# Patient Record
Sex: Female | Born: 1980 | Race: Black or African American | Hispanic: No | Marital: Single | Smoking: Never smoker
Health system: Southern US, Community
[De-identification: ages and names within clinical notes are randomized; demographics above are authoritative.]

## PROBLEM LIST (undated history)

## (undated) DIAGNOSIS — F419 Anxiety disorder, unspecified: Secondary | ICD-10-CM

## (undated) DIAGNOSIS — E669 Obesity, unspecified: Secondary | ICD-10-CM

## (undated) DIAGNOSIS — R7303 Prediabetes: Secondary | ICD-10-CM

## (undated) DIAGNOSIS — E66811 Obesity, class 1: Secondary | ICD-10-CM

## (undated) HISTORY — DX: Obesity, unspecified: E66.9

## (undated) HISTORY — DX: Obesity, class 1: E66.811

## (undated) HISTORY — DX: Prediabetes: R73.03

## (undated) HISTORY — DX: Anxiety disorder, unspecified: F41.9

## (undated) HISTORY — PX: OTHER SURGICAL HISTORY: SHX169

---

## 2009-09-11 ENCOUNTER — Inpatient Hospital Stay (HOSPITAL_COMMUNITY): Admission: AD | Admit: 2009-09-11 | Discharge: 2009-09-13 | Payer: Self-pay | Admitting: Obstetrics and Gynecology

## 2011-02-20 LAB — CBC
HCT: 37 % (ref 36.0–46.0)
Hemoglobin: 12.3 g/dL (ref 12.0–15.0)
MCHC: 33 g/dL (ref 30.0–36.0)
MCV: 91.8 fL (ref 78.0–100.0)
RBC: 2.95 MIL/uL — ABNORMAL LOW (ref 3.87–5.11)
RDW: 13.8 % (ref 11.5–15.5)

## 2011-06-20 ENCOUNTER — Other Ambulatory Visit: Payer: Self-pay | Admitting: Obstetrics and Gynecology

## 2011-06-20 DIAGNOSIS — R7989 Other specified abnormal findings of blood chemistry: Secondary | ICD-10-CM

## 2011-06-24 ENCOUNTER — Ambulatory Visit
Admission: RE | Admit: 2011-06-24 | Discharge: 2011-06-24 | Disposition: A | Discharge status: Home / Self Care | Payer: BC Managed Care – PPO | Source: Ambulatory Visit | Attending: Obstetrics and Gynecology | Admitting: Obstetrics and Gynecology

## 2011-06-24 DIAGNOSIS — R7989 Other specified abnormal findings of blood chemistry: Secondary | ICD-10-CM

## 2011-08-14 ENCOUNTER — Other Ambulatory Visit (HOSPITAL_COMMUNITY): Payer: Self-pay | Admitting: Endocrinology

## 2011-08-14 DIAGNOSIS — E0590 Thyrotoxicosis, unspecified without thyrotoxic crisis or storm: Secondary | ICD-10-CM

## 2011-08-21 ENCOUNTER — Ambulatory Visit (HOSPITAL_COMMUNITY): Payer: BC Managed Care – PPO

## 2011-08-22 ENCOUNTER — Other Ambulatory Visit (HOSPITAL_COMMUNITY): Payer: BC Managed Care – PPO

## 2011-09-03 ENCOUNTER — Ambulatory Visit (HOSPITAL_COMMUNITY): Payer: BC Managed Care – PPO

## 2011-09-04 ENCOUNTER — Other Ambulatory Visit (HOSPITAL_COMMUNITY): Payer: BC Managed Care – PPO

## 2011-10-01 ENCOUNTER — Encounter (HOSPITAL_COMMUNITY)
Admission: RE | Admit: 2011-10-01 | Discharge: 2011-10-01 | Disposition: A | Discharge status: Home / Self Care | Payer: BC Managed Care – PPO | Source: Ambulatory Visit | Attending: Endocrinology | Admitting: Endocrinology

## 2011-10-01 DIAGNOSIS — E0590 Thyrotoxicosis, unspecified without thyrotoxic crisis or storm: Secondary | ICD-10-CM | POA: Insufficient documentation

## 2011-10-02 ENCOUNTER — Ambulatory Visit (HOSPITAL_COMMUNITY)
Admission: RE | Admit: 2011-10-02 | Discharge: 2011-10-02 | Disposition: A | Discharge status: Home / Self Care | Payer: BC Managed Care – PPO | Source: Ambulatory Visit | Attending: Endocrinology | Admitting: Endocrinology

## 2011-10-02 MED ORDER — SODIUM PERTECHNETATE TC 99M INJECTION
10.0000 | Freq: Once | INTRAVENOUS | Status: AC | PRN
  Administered 2011-10-02: 10 via INTRAVENOUS

## 2011-10-02 MED ORDER — SODIUM IODIDE I 131 CAPSULE
7.0000 | Freq: Once | INTRAVENOUS | Status: AC | PRN
  Administered 2011-10-01: 7 via ORAL

## 2011-10-14 ENCOUNTER — Other Ambulatory Visit: Payer: Self-pay | Admitting: Endocrinology

## 2011-10-14 DIAGNOSIS — E041 Nontoxic single thyroid nodule: Secondary | ICD-10-CM

## 2011-10-23 ENCOUNTER — Other Ambulatory Visit (HOSPITAL_COMMUNITY)
Admission: RE | Admit: 2011-10-23 | Discharge: 2011-10-23 | Disposition: A | Discharge status: Home / Self Care | Payer: BC Managed Care – PPO | Source: Ambulatory Visit | Attending: Diagnostic Radiology | Admitting: Diagnostic Radiology

## 2011-10-23 ENCOUNTER — Ambulatory Visit
Admission: RE | Admit: 2011-10-23 | Discharge: 2011-10-23 | Disposition: A | Discharge status: Home / Self Care | Payer: BC Managed Care – PPO | Source: Ambulatory Visit | Attending: Endocrinology | Admitting: Endocrinology

## 2011-10-23 DIAGNOSIS — E049 Nontoxic goiter, unspecified: Secondary | ICD-10-CM | POA: Insufficient documentation

## 2011-10-23 DIAGNOSIS — E041 Nontoxic single thyroid nodule: Secondary | ICD-10-CM

## 2011-12-30 ENCOUNTER — Other Ambulatory Visit: Payer: Self-pay | Admitting: Endocrinology

## 2011-12-30 DIAGNOSIS — E041 Nontoxic single thyroid nodule: Secondary | ICD-10-CM

## 2012-04-19 ENCOUNTER — Inpatient Hospital Stay: Admission: RE | Admit: 2012-04-19 | Payer: BC Managed Care – PPO | Source: Ambulatory Visit

## 2012-05-31 ENCOUNTER — Ambulatory Visit (INDEPENDENT_AMBULATORY_CARE_PROVIDER_SITE_OTHER): Payer: BC Managed Care – PPO | Admitting: Cardiovascular Disease

## 2012-05-31 ENCOUNTER — Encounter: Payer: Self-pay | Admitting: Cardiovascular Disease

## 2012-05-31 VITALS — BP 126/74 | HR 72 | Ht 66.0 in | Wt 128.0 lb

## 2012-05-31 DIAGNOSIS — R079 Chest pain, unspecified: Secondary | ICD-10-CM

## 2012-05-31 DIAGNOSIS — Z01810 Encounter for preprocedural cardiovascular examination: Secondary | ICD-10-CM

## 2012-05-31 NOTE — Patient Instructions (Addendum)
Your ECG is normal.  You are clear to have the surgery done.

## 2012-05-31 NOTE — Progress Notes (Signed)
HPI  This is a 31 year old female who is here today for a preoperative cardiovascular evaluation. She is scheduled to have bilateral foot surgery tomorrow by Dr. Charlsie Merles. The patient mentioned recurrent episodes of chest pain and thus she is here for evaluation. She has no previous cardiac history or any chronic medical conditions. There is no history of congenital heart disease. She is not a smoker and has no family history of coronary artery disease. She had recurrent chest pain over the last 3-5 years. It's usually substernal radiating to the right side. It's described as tightness that happens stress but not with physical activities. She is able to perform all activities of daily living without limitations. She denies any dyspnea.  No Known Allergies   Current Outpatient Prescriptions on File Prior to Visit  Medication Sig Dispense Refill  . TRI-PREVIFEM 0.18/0.215/0.25 MG-35 MCG tablet as directed.         History reviewed. No pertinent past medical history.   History reviewed. No pertinent past surgical history.   Family History  Problem Relation Age of Onset  . Hypertension       History   Social History  . Marital Status: Single    Spouse Name: N/A    Number of Children: 1   . Years of Education: N/A   Occupational History  . Not on file.   Social History Main Topics  . Smoking status: Never Smoker   . Smokeless tobacco: Never Used  . Alcohol Use: No  . Drug Use: No  . Sexually Active: Not on file   Other Topics Concern  . Not on file   Social History Narrative  . No narrative on file     ROS Constitutional: Negative for fever, chills, diaphoresis, activity change, appetite change and fatigue.  HENT: Negative for hearing loss, nosebleeds, congestion, sore throat, facial swelling, drooling, trouble swallowing, neck pain, voice change, sinus pressure and tinnitus.  Eyes: Negative for photophobia, pain, discharge and visual disturbance.  Respiratory:  Negative for apnea, cough, shortness of breath and wheezing.  Cardiovascular: Negative for palpitations and leg swelling.  Gastrointestinal: Negative for nausea, vomiting, abdominal pain, diarrhea, constipation, blood in stool and abdominal distention.  Genitourinary: Negative for dysuria, urgency, frequency, hematuria and decreased urine volume.  Musculoskeletal: Negative for myalgias, back pain, joint swelling, arthralgias and gait problem.  Skin: Negative for color change, pallor, rash and wound.  Neurological: Negative for dizziness, tremors, seizures, syncope, speech difficulty, weakness, light-headedness, numbness and headaches.  Psychiatric/Behavioral: Negative for suicidal ideas, hallucinations, behavioral problems and agitation. The patient is not nervous/anxious.     PHYSICAL EXAM   BP 126/74  Pulse 72  Ht 5\' 6"  (1.676 m)  Wt 128 lb (58.06 kg)  BMI 20.66 kg/m2 Constitutional: She is oriented to person, place, and time. She appears well-developed and well-nourished. No distress.  HENT: No nasal discharge.  Head: Normocephalic and atraumatic.  Eyes: Pupils are equal and round. Right eye exhibits no discharge. Left eye exhibits no discharge.  Neck: Normal range of motion. Neck supple. No JVD present. No thyromegaly present.  Cardiovascular: Normal rate, regular rhythm, normal heart sounds. Exam reveals no gallop and no friction rub. No murmur heard.  Pulmonary/Chest: Effort normal and breath sounds normal. No stridor. No respiratory distress. She has no wheezes. She has no rales. She exhibits no tenderness.  Abdominal: Soft. Bowel sounds are normal. She exhibits no distension. There is no tenderness. There is no rebound and no guarding.  Musculoskeletal: Normal range  of motion. She exhibits no edema and no tenderness.  Neurological: She is alert and oriented to person, place, and time. Coordination normal.  Skin: Skin is warm and dry. No rash noted. She is not diaphoretic. No  erythema. No pallor.  Psychiatric: She has a normal mood and affect. Her behavior is normal. Judgment and thought content normal.     EKG: Sinus  Rhythm  WITHIN NORMAL LIMITS   ASSESSMENT AND PLAN

## 2012-05-31 NOTE — Assessment & Plan Note (Signed)
The patient reports chronic intermittent chest pain which seems to be related to stress and anxiety. She has no chronic medical conditions and no previous history of heart disease. She has no risk factors for coronary artery disease. Her physical exam is unremarkable and baseline ECG is within normal limits. Due to all of that, no further cardiac testing is needed before the surgery. She can proceed with the planned surgery at an overall low risk.

## 2012-06-21 ENCOUNTER — Other Ambulatory Visit: Payer: BC Managed Care – PPO

## 2014-12-11 ENCOUNTER — Other Ambulatory Visit: Payer: Self-pay | Admitting: Obstetrics and Gynecology

## 2014-12-11 DIAGNOSIS — R1013 Epigastric pain: Secondary | ICD-10-CM

## 2014-12-11 DIAGNOSIS — R5383 Other fatigue: Secondary | ICD-10-CM

## 2014-12-19 ENCOUNTER — Ambulatory Visit
Admission: RE | Admit: 2014-12-19 | Discharge: 2014-12-19 | Disposition: A | Discharge status: Home / Self Care | Payer: BLUE CROSS/BLUE SHIELD | Source: Ambulatory Visit | Attending: Obstetrics and Gynecology | Admitting: Obstetrics and Gynecology

## 2014-12-19 ENCOUNTER — Encounter (INDEPENDENT_AMBULATORY_CARE_PROVIDER_SITE_OTHER): Payer: Self-pay

## 2014-12-19 DIAGNOSIS — R1013 Epigastric pain: Secondary | ICD-10-CM

## 2014-12-19 DIAGNOSIS — R5383 Other fatigue: Secondary | ICD-10-CM

## 2015-01-15 ENCOUNTER — Other Ambulatory Visit: Payer: Self-pay | Admitting: Gastroenterology

## 2015-01-15 DIAGNOSIS — R1011 Right upper quadrant pain: Secondary | ICD-10-CM

## 2015-01-29 ENCOUNTER — Ambulatory Visit (HOSPITAL_COMMUNITY)
Admission: RE | Admit: 2015-01-29 | Discharge: 2015-01-29 | Disposition: A | Discharge status: Home / Self Care | Payer: BLUE CROSS/BLUE SHIELD | Source: Ambulatory Visit | Attending: Gastroenterology | Admitting: Gastroenterology

## 2015-01-29 DIAGNOSIS — R1011 Right upper quadrant pain: Secondary | ICD-10-CM | POA: Insufficient documentation

## 2015-01-29 MED ORDER — STERILE WATER FOR INJECTION IJ SOLN
INTRAMUSCULAR | Status: AC
  Administered 2015-01-29: 20 mL
  Filled 2015-01-29: qty 10

## 2015-01-29 MED ORDER — SINCALIDE 5 MCG IJ SOLR
0.0200 ug/kg | Freq: Once | INTRAMUSCULAR | Status: AC
  Administered 2015-01-29: 1.34 ug via INTRAVENOUS

## 2015-01-29 MED ORDER — SINCALIDE 5 MCG IJ SOLR
INTRAMUSCULAR | Status: AC
  Administered 2015-01-29: 1.34 ug via INTRAVENOUS
  Filled 2015-01-29: qty 5

## 2015-01-29 MED ORDER — TECHNETIUM TC 99M MEBROFENIN IV KIT
5.0000 | PACK | Freq: Once | INTRAVENOUS | Status: AC | PRN
  Administered 2015-01-29: 5 via INTRAVENOUS

## 2015-02-07 ENCOUNTER — Other Ambulatory Visit: Payer: Self-pay | Admitting: Gastroenterology

## 2015-02-07 DIAGNOSIS — R1033 Periumbilical pain: Secondary | ICD-10-CM

## 2015-02-09 ENCOUNTER — Ambulatory Visit (HOSPITAL_COMMUNITY): Payer: BLUE CROSS/BLUE SHIELD

## 2015-03-14 ENCOUNTER — Encounter (HOSPITAL_COMMUNITY): Payer: Self-pay

## 2015-03-14 ENCOUNTER — Ambulatory Visit (HOSPITAL_COMMUNITY)
Admission: RE | Admit: 2015-03-14 | Discharge: 2015-03-14 | Disposition: A | Discharge status: Home / Self Care | Payer: BLUE CROSS/BLUE SHIELD | Source: Ambulatory Visit | Attending: Gastroenterology | Admitting: Gastroenterology

## 2015-03-14 DIAGNOSIS — R1033 Periumbilical pain: Secondary | ICD-10-CM | POA: Diagnosis not present

## 2015-03-14 MED ORDER — IOHEXOL 300 MG/ML  SOLN
80.0000 mL | Freq: Once | INTRAMUSCULAR | Status: AC | PRN
  Administered 2015-03-14: 80 mL via INTRAVENOUS

## 2015-03-14 MED ORDER — IOHEXOL 300 MG/ML  SOLN: 80.0000 mL | Freq: Once | INTRAMUSCULAR | Status: AC | PRN

## 2015-10-31 IMAGING — CT CT ABD-PELV W/ CM
2 of 4 series · 16 of 46 positions shown, 18 images · IV contrast (Omni 300)
Comparison: None.

CLINICAL DATA: Upper abdominal pain for 1 month.

EXAM:
CT ABDOMEN AND PELVIS WITH CONTRAST
TECHNIQUE: Multidetector CT imaging of the abdomen and pelvis was performed
using the standard protocol following bolus administration of
intravenous contrast.
CONTRAST:  80mL OMNIPAQUE IOHEXOL 300 MG/ML  SOLN

[Series 2: abd/ pelvis 5.0 i30f 1 · axial · 0.67mm/px · z∈[-446,-66]mm · 13 of 84 slices shown, 15 images]
[im 4/84  soft-tissue]
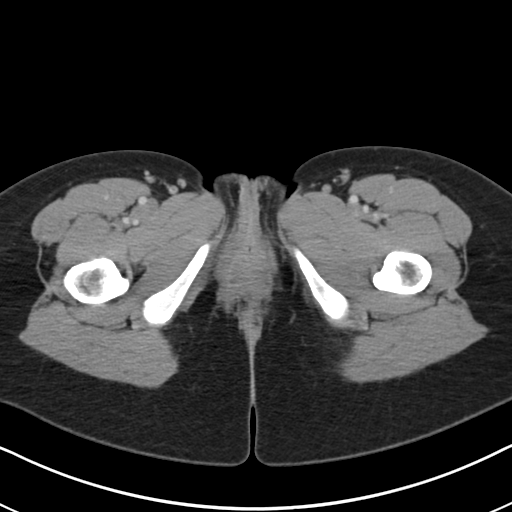
[im 4/84  bone]
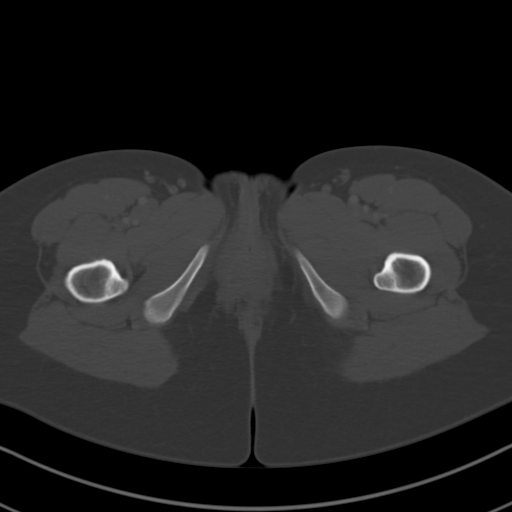
[im 10/84  soft-tissue]
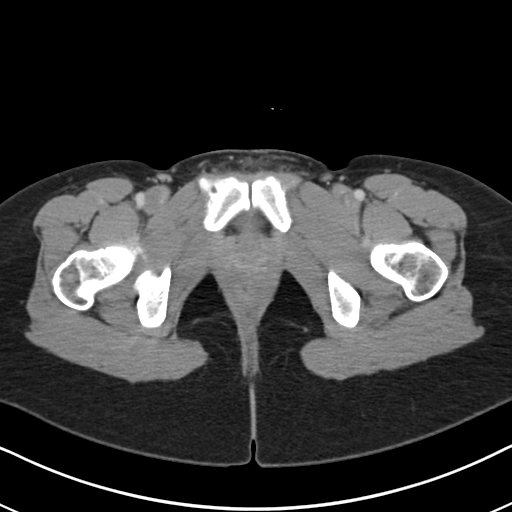
[im 17/84  soft-tissue]
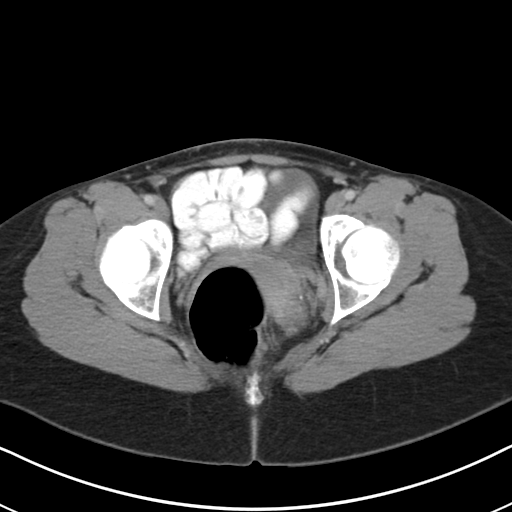
[im 24/84  soft-tissue]
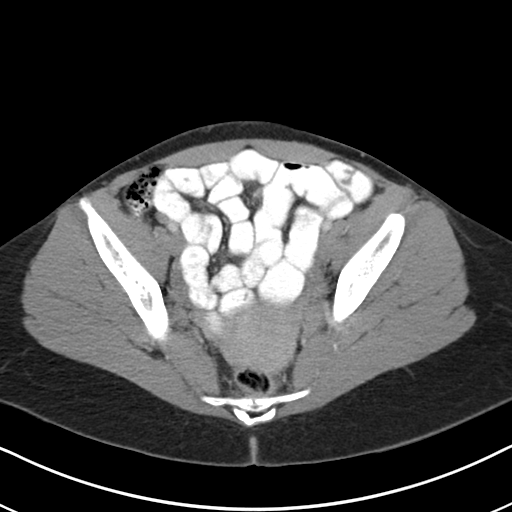
[im 30/84  soft-tissue]
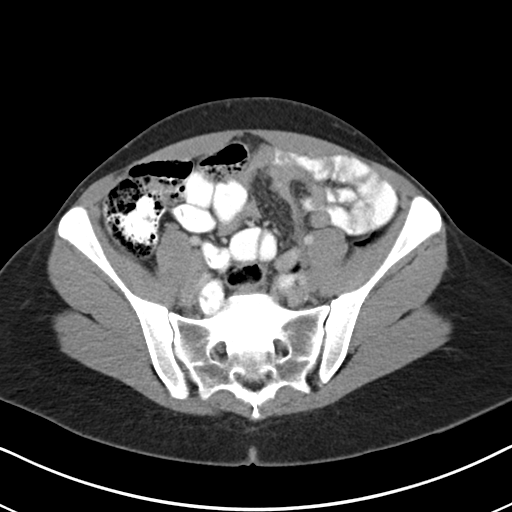
[im 37/84  soft-tissue]
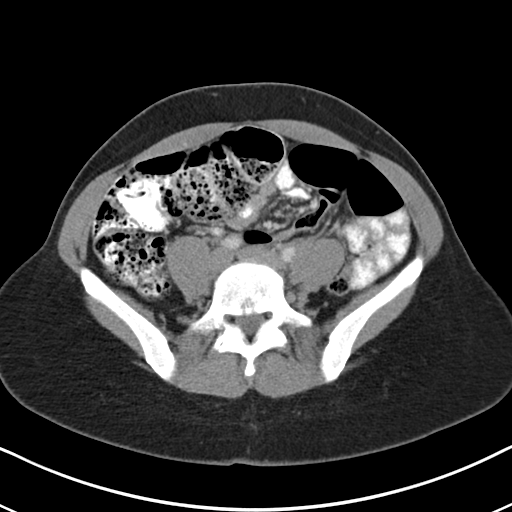
[im 44/84  soft-tissue]
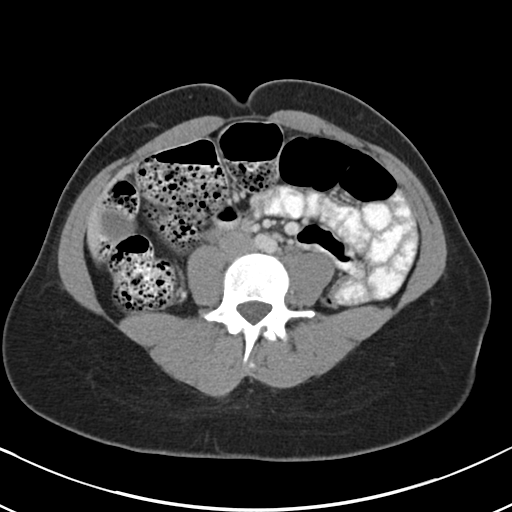
[im 47/84  soft-tissue]
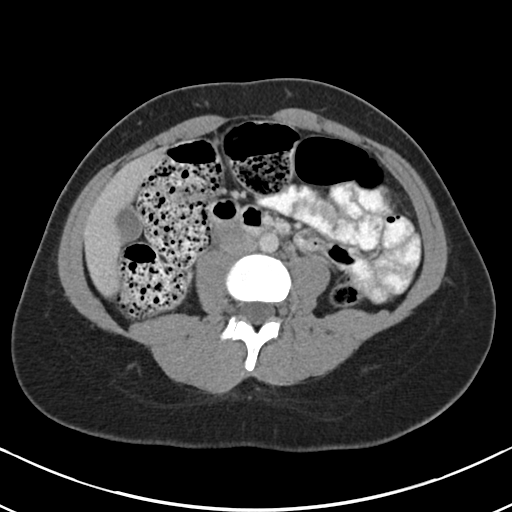
[im 54/84  soft-tissue]
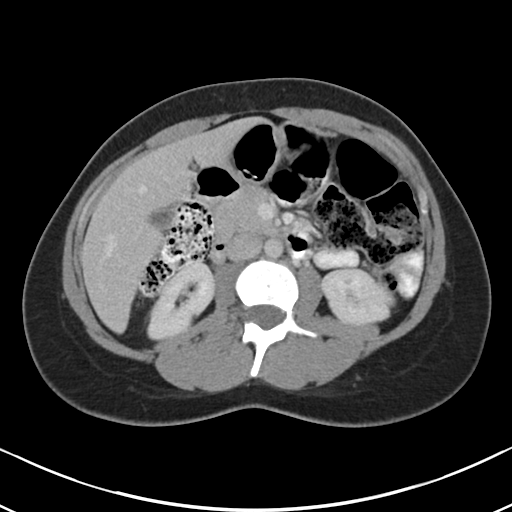
[im 54/84  bone]
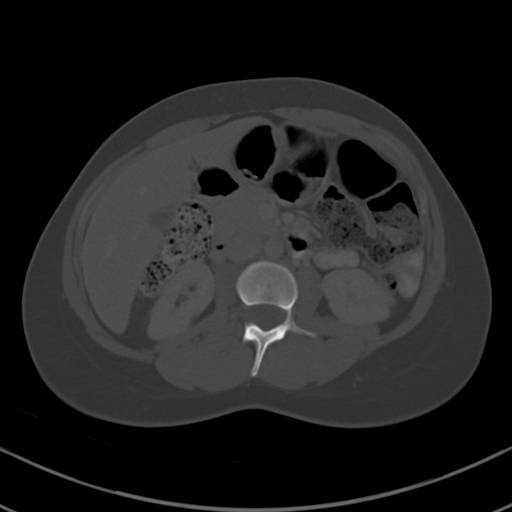
[im 60/84  soft-tissue]
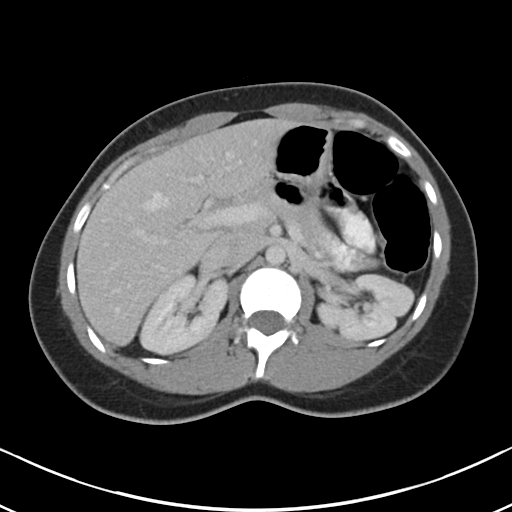
[im 67/84  soft-tissue]
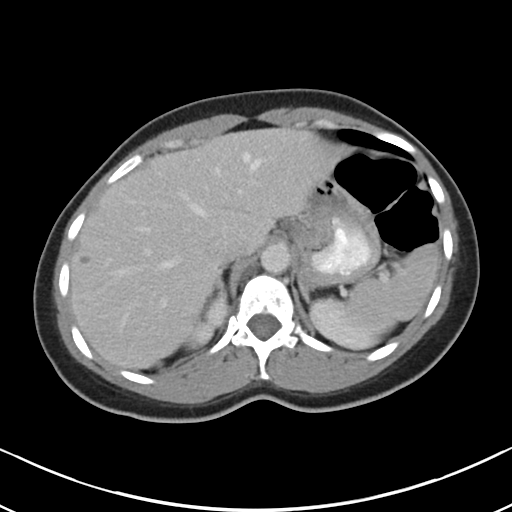
[im 74/84  soft-tissue]
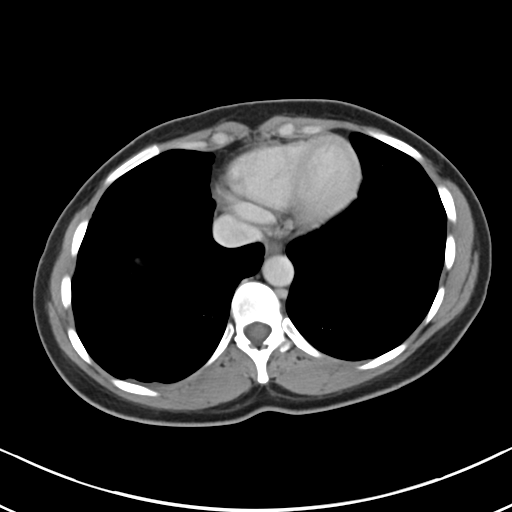
[im 80/84  soft-tissue]
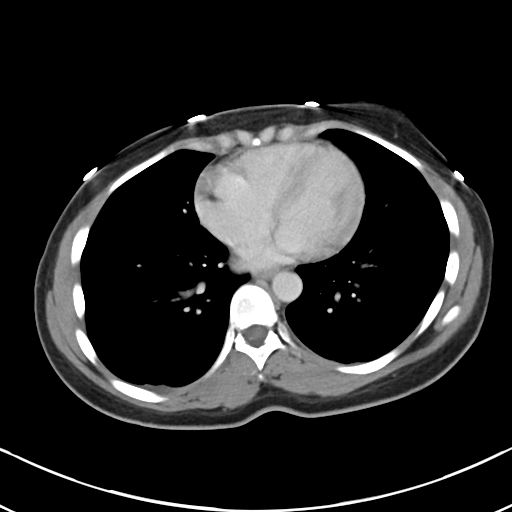

[Series 5: coronals · coronal · 0.70mm/px · 3 of 180 slices shown]
[im 60/180  soft-tissue]
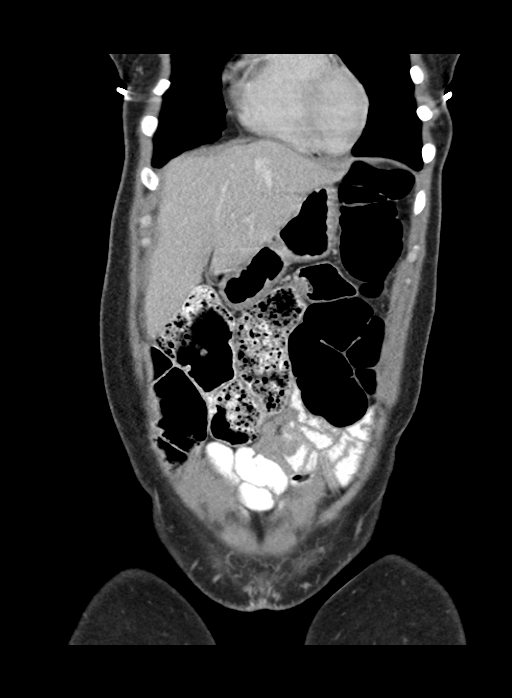
[im 80/180  soft-tissue]
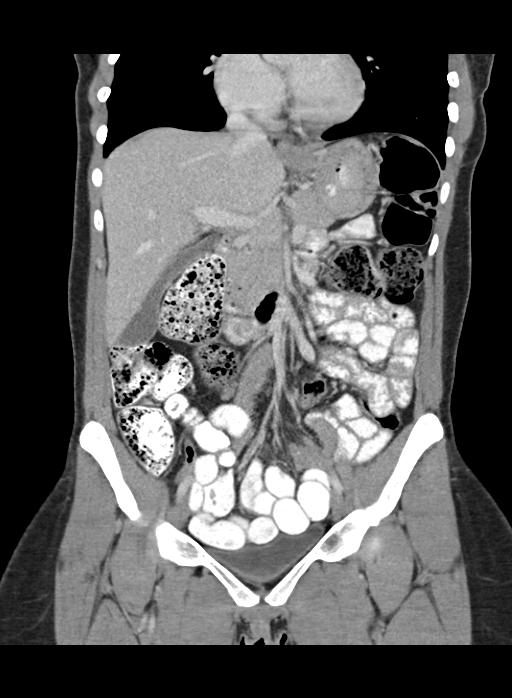
[im 100/180  soft-tissue]
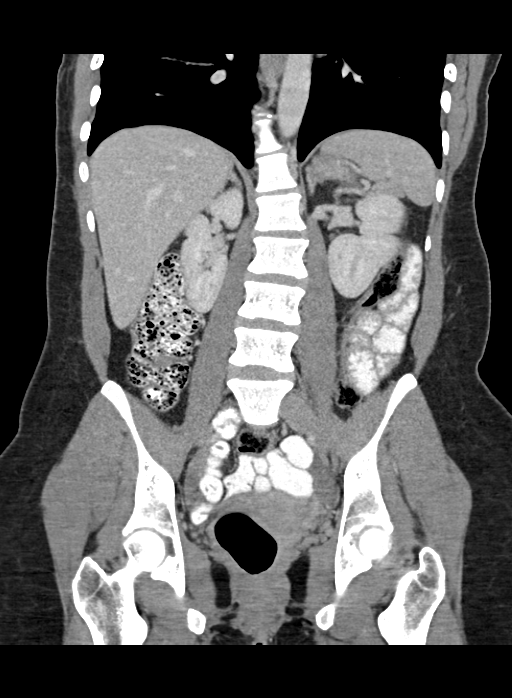

[16 of 46 positions shown; findings below may reference images not displayed]

FINDINGS: Lower chest:  No pleural effusion.  The lung bases appear clear.

Hepatobiliary: Low-attenuation structure is identified within the
right hepatic lobe measuring 5 mm. This is too small to
characterize. The gallbladder appears within normal limits. There is
no biliary dilatation.

Pancreas: Normal appearance of the pancreas.

Spleen: The spleen is unremarkable.

Adrenals/Urinary Tract: The adrenal glands are both within normal
limits. The right kidney is normal. 5 mm low attenuation structure
within the upper of the left kidney is identified. This is too small
to characterize. The urinary bladder appears normal.

Stomach/Bowel: The stomach and the small bowel loops have a normal
course and caliber. The appendix is visualized and appears normal.
There is a moderate amount of stool within the ascending colon and
transverse colon.

Vascular/Lymphatic: Normal appearance of the abdominal aorta. No
enlarged retroperitoneal or mesenteric adenopathy. No enlarged
pelvic or inguinal lymph nodes.

Reproductive: The uterus and the adnexal structures appear within
normal limits.

Other: No free fluid or fluid collections within the abdomen or
pelvis.

Musculoskeletal: There is a mild curvature of the thoracic and
lumbar spine. No aggressive lytic or sclerotic bone lesions
identified.
IMPRESSION: 1. No acute findings identified within the abdomen or pelvis. No
explanation for upper abdominal pain.
2. Moderate stool burden identified within the colon and rectum.
Correlate for any clinical signs or symptoms of constipation.

## 2017-10-06 ENCOUNTER — Ambulatory Visit: Payer: Self-pay | Admitting: Family Medicine

## 2022-03-24 ENCOUNTER — Telehealth: Payer: Self-pay

## 2022-03-24 NOTE — Telephone Encounter (Signed)
NOTES SCANNED TO REFERRAL 

## 2022-04-01 ENCOUNTER — Encounter: Payer: Self-pay | Admitting: Cardiology

## 2022-04-01 ENCOUNTER — Ambulatory Visit (INDEPENDENT_AMBULATORY_CARE_PROVIDER_SITE_OTHER): Payer: BC Managed Care – PPO | Admitting: Cardiology

## 2022-04-01 VITALS — BP 140/100 | HR 75 | Ht 65.0 in | Wt 201.6 lb

## 2022-04-01 DIAGNOSIS — R0789 Other chest pain: Secondary | ICD-10-CM

## 2022-04-01 DIAGNOSIS — R002 Palpitations: Secondary | ICD-10-CM | POA: Diagnosis not present

## 2022-04-01 NOTE — Patient Instructions (Addendum)
Medication Instructions:  ? ?No changes ? ?*If you need a refill on your cardiac medications before your next appointment, please call your pharmacy* ? ? ?Lab Work: ? ?Not needed ? ? ?Testing/Procedures: ?Not needed ? ? ?Follow-Up: ?At [facility], you and your health needs are our priority.  As part of our continuing mission to provide you with exceptional heart care, we have created designated Provider Care Teams.  These Care Teams include your primary Cardiologist (physician) and Advanced Practice Providers (APPs -  Physician Assistants and Nurse Practitioners) who all work together to provide you with the care you need, when you need it. ? ?  ? ?Your next appointment:   ?6 month(s) ? ?The format for your next appointment:   ?In Person ? ?Provider:   ?Glenetta Hew, MD  ? ? ?Other Instructions  ?We recommend that you order a Kardia monitor for home use when the palpitations occur. You can attach your kardia monitor EKG to your MyChart account and send to [place] for review    ? ?    DR Ellyn Hack RECOMMENDS THAT YOU  PURCHASES  " Kardia" By [employer]. FROM THE  [employer]  APP PLAY STORE.  ? THE APP IS FREE , BUT THE  EQUIPMENT HAS A COST. ?IT IS A WAY FOR YOU TO OBTAIN A RECORDING OF YOUR HEART RATE . IT WILL BE A SHORT RHYTHM  STRIP THAT YOU CAN SHOW TO MEDICAL STAFF. ? ?ALSO , YOU MAY [place] - EKG- MONITOR ?YOU ARE ABLE TO KEEP RECORDINGS ON YOUR SMARTPHONE , INSTEAD OF PURCHASING A SEPARATE EQUIPMENT ? ? Dr Ellyn Hack  recommends portable  ECG monitor  to capture heart rate reading. ?There is a monitor that can be purchased on [employer]. The  monitor name is "EMAY"  ?at last checked it cost $79.  Our understanding there no more cost of that purchasing the machine. ?You will need a computer to be able to upload your recording and to be printed. It also come with a  ?USB to charge the monitor.  ?

## 2022-04-01 NOTE — Progress Notes (Signed)
HPI:  Patient reports recurrent sporadically for the last 8 months as follows:  - Skipping beats sensation: has occurred about 4 times. No associated chest pain or shortness of breath, or dizziness. Only last for a few seconds. - Waking up with chest tightness: about 3 times total. All episodes were present upon wakening but did not wake her from sleep. Squeezing pain in the center of the chest with no other associated symptoms. - Sharp shooting pain right chest pain. Had initially improved after working out. Has occurred about 4 times. She believes that this pain is associated with stress. No aggravating factors other than stress.   Patient does feel that most of her symptoms are related to stress but wants to make sure that it is not her heart. 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   PMH: Pre-diabetes, stress/anxiety.  Fam Hx: HTN (maternal and paternal), HLD (paternal), stroke (maternal in April at age 25- wasn't taking BP meds)   PE: Gen: well-appearing, NAD CV: RRR, no m/r/g appreciated, no peripheral edema Pulm: CTAB, no wheezes/crackles Chest: chest wall tenderness of the right costochondral angles   A/P #Intermittent atypical chest pain Atypical with chest pressure that occurs upon wakening. Patient does not have any other associated symptoms. There is consideration for a vasospasm but unclear from history and only lasts a few seconds, which would not be consistent with spasm. At this time, considering more MSK etiology (likely costochondritis). - Deferring further work-up at this time given lack of risk factors unless symptoms become more recurrent or increasingly symptomatic - Discussed anxiety control - Follow-up in 6 months to check-in  #Palpitations Likely related to stress. Doesn't occur often enough for formal monitoring to be beneficial and capture an actual event.  Likely secondary to either stress, PVCs vs PACs.  - Recommend home monitoring kit and record during the episodes  - 6 month follow-up - counseled on reasons to return to care sooner

## 2022-04-01 NOTE — Progress Notes (Signed)
Primary Care Provider: [facility] Cardiologist: Glenetta Hew, MD Electrophysiologist: None  Clinic Note: Chief Complaint  Patient presents with   New Patient (Initial Visit)    Chest pain   ===================================  ASSESSMENT/PLAN   Problem List Items Addressed This Visit     Atypical chest pain (Chronic)    Atypical with chest pressure that occurs upon wakening. Patient does not have any other associated symptoms. There is consideration for a vasospasm but unclear from history and only lasts a few seconds, which would not be consistent with spasm. At this time, considering more MSK etiology (likely costochondritis). - Deferring further work-up at this time given lack of risk factors unless symptoms become more recurrent or increasingly symptomatic - Discussed anxiety control - Follow-up in 6 months to check-in .  Very atypical sounding chest discomfort there is some point tenderness which would suggest costochondritis, and also in association with neck and shoulder pain.  More likely this is all stemming from her anxiety.  We talked about treatment of costochondritis with NSAID taper.  Since his symptoms are not exertional in nature, very unlikely to be cardiac.  I would rather reassess in 4 to 6 months prior to considering stress testing or any other type of ischemic evaluation in this relatively healthy young woman.       Palpitations - Primary    Likely related to stress. Doesn't occur often enough for formal monitoring to be beneficial and capture an actual event. Likely secondary to either stress, PVCs vs PACs.  - Recommend home monitoring kit and record during the episodes  - 6 month follow-up - counseled on reasons to return to care sooner  Rare palpitations.  Probably will happen with enough frequency to capture anything on an event monitor.  Recommend home monitoring device - [facility] which she can use periodically  to assess palpitations that come on. Stressed importance of adequate hydration.  However to be mostly stressed importance of seeking out counseling for stress and anxiety. Also stressed the importance of taking care of herself in the setting of being caregiver for her mother and others.  Taking individual time for self-help is crucial       Relevant Orders   EKG 12-Lead (Completed)    ===================================  HPI:    Cynthia Lane is a 41 y.o. female who is being seen today for the evaluation of CHEST TIGHTNESS/PALPITATIONS at the request of Stanislaw, Breejante J, *, PA-C.  Cynthia Lane was seen on 03/22/2019 Cynthia Lane.  Chief complaint was an episode of chest tightness associated with hot temperature.  Lasted few seconds.  Had noticed this previously before she started doing her workouts, but this was the first episode that occurred since starting working out..  Also having palpitations for a year> described as feeling like his heart pauses and then starts back up.  Worse when stressed.  No family history of CAD.  Requesting cardiology referral. She also noted right leg pain occurring after driving several hours.  Was throbbing and shooting.  No swelling. D-dimer checked.  Along with CBC CMP and TSH. Blood pressure was elevated at 150/98.  Recent Hospitalizations: None  Reviewed  CV studies:    The following studies were reviewed today: (if available, images/films reviewed: From Epic Chart or Care Everywhere) None:  Interval History:   Cynthia Lane presents here today for evaluation of chest pain and skipping heartbeats that have been occurring sporadically over the last 8  months: - Skipping beats sensation: has occurred about 4 times. No associated chest pain or shortness of breath, or dizziness. Only last for a few seconds. - Waking up with chest tightness: about 3 times total. All episodes were present upon wakening but did not wake her from sleep.  Squeezing pain in the center of the chest with no other associated symptoms. - Sharp shooting pain right chest pain. Had initially improved after working out. Has occurred about 4 times. She believes that this pain is associated with stress. No aggravating factors other than stress.    Patient does feel that most of her symptoms are related to stress but wants to make sure that it is not her heart. 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 -Cynthia Lane is now the primary caregiver in addition to being a Pharmacist, hospital and caring for her own family.  Initially, when this began things were working out well, however, when the school year began, it became very difficult.  CV Review of Symptoms (Summary) Cardiovascular ROS: positive for - chest pain, irregular heartbeat, palpitations, and rapid heart rate negative for - dyspnea on exertion, edema, orthopnea, paroxysmal nocturnal dyspnea, shortness of breath, or syncope/near syncope or TIA/claudication  REVIEWED OF SYSTEMS   Review of Systems  Constitutional:  Negative for malaise/fatigue.  HENT:  Negative for nosebleeds.   Respiratory:  Positive for cough. Negative for wheezing.   Cardiovascular:  Positive for chest pain, palpitations and leg swelling. Negative for orthopnea, claudication and PND.  Gastrointestinal:  Negative for abdominal pain, blood in stool, melena and vomiting.  Genitourinary:  Negative for dysuria, hematuria and urgency.  Musculoskeletal:  Positive for joint pain and neck pain (And shoulder pain).       Right leg pain  Neurological:  Positive for dizziness. Negative for focal weakness.  Psychiatric/Behavioral:  Negative for depression. The patient is not nervous/anxious.        Significant social stress   I have reviewed and (if needed) personally updated the patient's problem list, medications, allergies, past medical and surgical  history, social and family history.   PAST MEDICAL HISTORY   Past Medical History:  Diagnosis Date   Anxiety disorder, unspecified    Obesity (BMI 30.0-34.9)    Prediabetes     PAST SURGICAL HISTORY   Past Surgical History:  Procedure Laterality Date   none       There is no immunization history on file for this patient.  MEDICATIONS/ALLERGIES   Current Meds  Medication Sig   Prenatal Vit-Fe Fumarate-FA (MULTIVITAMIN-PRENATAL) 27-0.8 MG TABS tablet Take 1 tablet by mouth daily at 12 noon.    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 -[place] is now the primary caregiver in addition to being a Pharmacist, hospital and caring for her own family.  Initially, when this began things were working out well, however, when the school year began, it became very difficult   Single non-smoker.  No  drugs.  Family History  Problem Relation Age of Onset   Stroke Mother 47       Was not taking her medications.   Hypertension Mother    Hypertension Father    Cancer Maternal Grandmother    Cancer Maternal Grandfather    Hypertension Other   Father has hypertension both maternal grandparents had cancer Fam Hx: HTN (maternal and paternal), HLD (paternal), stroke (maternal in April at age 44- wasn't taking BP meds)  OBJCTIVE -PE, EKG, labs   Wt Readings from Last 3 Encounters:  04/01/22 201 lb 9.6 oz (91.4 kg)  05/31/12 128 lb (58.1 kg)    Physical Exam: BP (!) 140/100 (BP Location: Left Arm)   Pulse 75   Ht 5' 5"  (1.651 m)   Wt 201 lb 9.6 oz (91.4 kg)   SpO2 100%   BMI 33.55 kg/m  Physical Exam Vitals reviewed.   Constitutional:      General: She is not in acute distress.    Appearance: Normal appearance. She is obese. She is not ill-appearing (Healthy-appearing.  Well-groomed.) or toxic-appearing.  HENT:     Head: Normocephalic and atraumatic.  Neck:     Vascular: No carotid bruit.  Cardiovascular:     Rate and Rhythm: Normal rate and regular rhythm.     Pulses: Normal pulses.     Heart sounds: Normal heart sounds. No murmur heard.   No friction rub. No gallop.  Pulmonary:     Effort: Pulmonary effort is normal. No respiratory distress.     Breath sounds: Normal breath sounds. No wheezing, rhonchi or rales.  Chest:     Chest wall: Tenderness (Right greater than left costosternal border.) present.  Musculoskeletal:        General: No swelling. Normal range of motion.     Cervical back: Normal range of motion and neck supple.  Skin:    General: Skin is warm and dry.  Neurological:     General: No focal deficit present.     Mental Status: She is alert and oriented to person, place, and time.     Motor: No weakness.     Gait: Gait normal.  Psychiatric:        Mood and Affect: Mood normal.        Behavior: Behavior normal.        Thought Content: Thought content normal.        Judgment: Judgment normal.     Comments: Anxious    Adult ECG Report  Rate: 75 ;  Rhythm: normal sinus rhythm and LAE with normal axis, intervals & durations ;   Narrative Interpretation: borderline EKG  EKG from PCP 03/21/2022: Sinus rhythm, rate 60 bpm.  Nonspecific ST and T wave versions in III subtle flattening ST segments in aVF  Recent Labs:  reviewed   No results found for: CHOL, HDL, LDLCALC, LDLDIRECT, TRIG, CHOLHDL No results found for: CREATININE, BUN, NA, K, CL, CO2    Latest Ref Rng & Units 09/12/2009    5:35 AM 09/11/2009    8:07 AM  CBC  WBC 4.0 - 10.5 K/uL 14.2   12.8    Hemoglobin 12.0 - 15.0 g/dL 9.0 DELTA CHECK NOTED   12.3    Hematocrit 36.0 - 46.0 % 27.1   37.0    Platelets 150 - 400  K/uL 163   198      No results found for: HGBA1C No results found for: TSH  ==================================================  COVID-19 Education: The signs  and symptoms of COVID-19 were discussed with the patient and how to seek care for testing (follow up with PCP or arrange E-visit).    I spent a total of 22 minutes with the patient spent in direct patient consultation.  Additional time spent with chart review  / charting (studies, outside notes, etc): 21 min Total Time: 43 min  Current medicines are reviewed at length with the patient today.  (+/- concerns) n/a  Notice: This dictation was prepared with Dragon dictation along with smart phrase technology. Any transcriptional errors that result from this process are unintentional and may not be corrected upon review.   Studies Ordered:  Orders Placed This Encounter  Procedures   EKG 12-Lead   No orders of the defined types were placed in this encounter.  No orders of the defined types were placed in this encounter.   Patient Instructions / Medication Changes & Studies & Tests Ordered   Patient Instructions  Medication Instructions:   No changes  *If you need a refill on your cardiac medications before your next appointment, please call your pharmacy*   Lab Work:  Not needed   Testing/Procedures: Not needed   Follow-Up: At [facility], you and your health needs are our priority.  As part of our continuing mission to provide you with exceptional heart care, we have created designated Provider Care Teams.  These Care Teams include your primary Cardiologist (physician) and Advanced Practice Providers (APPs -  Physician Assistants and Nurse Practitioners) who all work together to provide you with the care you need, when you need it.     Your next appointment:   6 month(s)  The format for your next appointment:   In Person  Provider:   Glenetta Hew, MD    Other Instructions  We recommend that you  order a Kardia monitor for home use when the palpitations occur. You can attach your kardia monitor EKG to your MyChart account and send to [place] for review         DR Vere Diantonio RECOMMENDS THAT YOU  PURCHASES  " Kardia" By [employer]. FROM THE  [employer]  APP PLAY STORE.   THE APP IS FREE , BUT THE  EQUIPMENT HAS A COST. IT IS A WAY FOR YOU TO OBTAIN A RECORDING OF YOUR HEART RATE . IT WILL BE A SHORT RHYTHM  STRIP THAT YOU CAN SHOW TO MEDICAL STAFF.  ALSO , YOU MAY [place] - EKG- MONITOR YOU ARE ABLE TO KEEP RECORDINGS ON YOUR SMARTPHONE , INSTEAD OF PURCHASING A SEPARATE EQUIPMENT   Dr Ellyn Hack  recommends portable  ECG monitor  to capture heart rate reading. There is a monitor that can be purchased on [employer]. The  monitor name is "EMAY"  at last checked it cost $79.  Our understanding there no more cost of that purchasing the machine. You will need a computer to be able to upload your recording and to be printed. It also come with a  USB to charge the monitor.    HPI & Major portion of A&P performed and written by Alana Lilland, DO.  Signed,  Alana Liliand, DO (FM R2).   ATTENDING ATTESTATION  I have seen, examined and evaluated the patient along with the Resident Physician in clinic today.  I personally performed my own interview & exanimation.  After reviewing all the available data and chart, we discussed the patients laboratory, study & physical findings as well as symptoms in detail. I  agree with her findings, examination as well as impression recommendations as per our discussion.  Examination and review of symptoms performed by attending physician.  Attending adjustments int the full clinic noted annotated in [place].     Glenetta Hew, M.D., M.S. Interventional Cardiologist   Pager # [phone] Phone # [phone] [address] [place], [place] [address]    Thank you for choosing Heartcare at [facility] [place]!!

## 2022-04-04 ENCOUNTER — Encounter: Payer: Self-pay | Admitting: Cardiology

## 2022-04-04 DIAGNOSIS — R002 Palpitations: Secondary | ICD-10-CM | POA: Insufficient documentation

## 2022-04-04 DIAGNOSIS — R0789 Other chest pain: Secondary | ICD-10-CM | POA: Insufficient documentation

## 2022-04-04 NOTE — Assessment & Plan Note (Addendum)
Likely related to stress. Doesn't occur often enough for formal monitoring to be beneficial and capture an actual event. Likely secondary to either stress, PVCs vs PACs.  - Recommend home monitoring kit and record during the episodes  - 6 month follow-up - counseled on reasons to return to care sooner  Rare palpitations.  Probably will happen with enough frequency to capture anything on an event monitor.  Recommend home monitoring device - [facility] which she can use periodically to assess palpitations that come on. Stressed importance of adequate hydration.  However to be mostly stressed importance of seeking out counseling for stress and anxiety. Also stressed the importance of taking care of herself in the setting of being caregiver for her mother and others.  Taking individual time for self-help is crucial

## 2022-04-04 NOTE — Assessment & Plan Note (Signed)
Atypical with chest pressure that occurs upon wakening. Patient does not have any other associated symptoms. There is consideration for a vasospasm but unclear from history and only lasts a few seconds, which would not be consistent with spasm. At this time, considering more MSK etiology (likely costochondritis). - Deferring further work-up at this time given lack of risk factors unless symptoms become more recurrent or increasingly symptomatic - Discussed anxiety control - Follow-up in 6 months to check-in .  Very atypical sounding chest discomfort there is some point tenderness which would suggest costochondritis, and also in association with neck and shoulder pain.  More likely this is all stemming from her anxiety.  We talked about treatment of costochondritis with NSAID taper.  Since his symptoms are not exertional in nature, very unlikely to be cardiac.  I would rather reassess in 4 to 6 months prior to considering stress testing or any other type of ischemic evaluation in this relatively healthy young woman.

## 2022-10-06 ENCOUNTER — Ambulatory Visit: Payer: BC Managed Care – PPO | Attending: Cardiology | Admitting: Cardiology

## 2022-10-23 ENCOUNTER — Encounter: Payer: Self-pay | Admitting: Cardiology

## 2023-04-27 ENCOUNTER — Other Ambulatory Visit: Payer: Self-pay

## 2023-04-27 ENCOUNTER — Emergency Department (HOSPITAL_BASED_OUTPATIENT_CLINIC_OR_DEPARTMENT_OTHER): Payer: BC Managed Care – PPO | Admitting: Radiology

## 2023-04-27 ENCOUNTER — Emergency Department (HOSPITAL_BASED_OUTPATIENT_CLINIC_OR_DEPARTMENT_OTHER)
Admission: EM | Admit: 2023-04-27 | Discharge: 2023-04-27 | Disposition: A | Discharge status: Home / Self Care | Payer: BC Managed Care – PPO | Attending: Emergency Medicine | Admitting: Emergency Medicine

## 2023-04-27 ENCOUNTER — Encounter (HOSPITAL_BASED_OUTPATIENT_CLINIC_OR_DEPARTMENT_OTHER): Payer: Self-pay | Admitting: Emergency Medicine

## 2023-04-27 ENCOUNTER — Emergency Department (HOSPITAL_BASED_OUTPATIENT_CLINIC_OR_DEPARTMENT_OTHER): Payer: BC Managed Care – PPO

## 2023-04-27 DIAGNOSIS — Z8616 Personal history of COVID-19: Secondary | ICD-10-CM | POA: Diagnosis not present

## 2023-04-27 DIAGNOSIS — M79661 Pain in right lower leg: Secondary | ICD-10-CM | POA: Insufficient documentation

## 2023-04-27 DIAGNOSIS — M79604 Pain in right leg: Secondary | ICD-10-CM

## 2023-04-27 DIAGNOSIS — R072 Precordial pain: Secondary | ICD-10-CM | POA: Diagnosis not present

## 2023-04-27 DIAGNOSIS — R079 Chest pain, unspecified: Secondary | ICD-10-CM | POA: Diagnosis present

## 2023-04-27 LAB — CBC
HCT: 41 % (ref 36.0–46.0)
Hemoglobin: 13.3 g/dL (ref 12.0–15.0)
MCH: 27.5 pg (ref 26.0–34.0)
MCHC: 32.4 g/dL (ref 30.0–36.0)
MCV: 84.9 fL (ref 80.0–100.0)
Platelets: 283 10*3/uL (ref 150–400)
RBC: 4.83 MIL/uL (ref 3.87–5.11)
RDW: 14.7 % (ref 11.5–15.5)
WBC: 7.5 10*3/uL (ref 4.0–10.5)
nRBC: 0 % (ref 0.0–0.2)

## 2023-04-27 LAB — BASIC METABOLIC PANEL
Anion gap: 12 (ref 5–15)
BUN: 9 mg/dL (ref 6–20)
CO2: 24 mmol/L (ref 22–32)
Calcium: 9.4 mg/dL (ref 8.9–10.3)
Chloride: 103 mmol/L (ref 98–111)
Creatinine, Ser: 0.83 mg/dL (ref 0.44–1.00)
GFR, Estimated: >60 mL/min (ref >60)
Glucose, Bld: 70 mg/dL (ref 70–99)
Potassium: 3.7 mmol/L (ref 3.5–5.1)
Sodium: 139 mmol/L (ref 135–145)

## 2023-04-27 LAB — PREGNANCY, URINE: Preg Test, Ur: NEGATIVE

## 2023-04-27 LAB — D-DIMER, QUANTITATIVE: D-Dimer, Quant: 0.46 ug/mL-FEU (ref 0.00–0.50)

## 2023-04-27 LAB — TROPONIN I (HIGH SENSITIVITY)
Troponin I (High Sensitivity): 2 ng/L (ref <18)
Troponin I (High Sensitivity): 3 ng/L (ref <18)

## 2023-04-27 MED ORDER — OMEPRAZOLE 20 MG PO CPDR: 20.0000 mg | DELAYED_RELEASE_CAPSULE | Freq: Every day | ORAL | 0 refills | Status: AC

## 2023-04-27 MED ORDER — FAMOTIDINE 20 MG PO TABS: 20.0000 mg | ORAL_TABLET | Freq: Two times a day (BID) | ORAL | 0 refills | Status: AC

## 2023-04-27 NOTE — ED Triage Notes (Signed)
Pt arrives to ED with c/o chest pains and right leg pain x1 month post COVID.

## 2023-04-27 NOTE — ED Notes (Signed)
Patient transported to Ultrasound 

## 2023-04-27 NOTE — ED Provider Notes (Signed)
[place] EMERGENCY DEPARTMENT AT [facility] Provider Note   CSN: [id number] Arrival date & time: 04/27/23  1705     History  Chief Complaint  Patient presents with   Chest Pain   Leg Pain    Cynthia Lane is a 42 y.o. female.  Patient presents to the ED with chief complaint of chest pain and leg pain x 1 month. She notes this began after she tested positive for COVID-19 and started as shooting chest pains that woke her up in the middle of the night. The pain is mainly over her sternum and does not radiate. Her leg pain is mostly located posterior to her right knee and bothers her more when she has been standing and walking extensively. She also experiences bilateral ankle swelling often.  The chest pain started around the same time as the right leg pain and has been intermittent.  She does think that the pain in the chest is worsened when she eats unhealthy foods, and kind of eases when she eats healthier.  She denies hemoptysis and pleuritic chest pain. She is a non-smoker and does not take hormonal birth control. The patient mentions that her mother recently had a stroke and is now undergoing treatment for cancer, which has put great stress on her as her primary caretaker.  No recent long travel or surgeries.  She does travel approximately an hour to and from work every day.  No associated diaphoresis or vomiting with the CP.  Denies significant chest pain at the current time.  She presented to the ED because she called to make an appointment with her doctor today and they recommended that she seek care prior to appointment which was scheduled in 4 days.       Home Medications Prior to Admission medications   Medication Sig Start Date End Date Taking? Authorizing Provider  Prenatal Vit-Fe Fumarate-FA (MULTIVITAMIN-PRENATAL) 27-0.8 MG TABS tablet Take 1 tablet by mouth daily at 12 noon.    [provider]  TRI-PREVIFEM 0.18/0.215/0.25 MG-35 MCG tablet as  directed. Patient not taking: Reported on 04/01/2022 05/05/12   [provider]      Allergies    Patient has no known allergies.    Review of Systems   Review of Systems  Physical Exam Updated Vital Signs BP (!) 185/116   Pulse 88   Temp 98.4 F (36.9 C) (Oral)   Resp (!) 25   Ht 5\' 5"  (1.651 m)   Wt 97.5 kg   SpO2 98%   BMI 35.78 kg/m   Physical Exam Vitals and nursing note reviewed.  Constitutional:      Appearance: She is well-developed. She is not diaphoretic.  HENT:     Head: Normocephalic and atraumatic.     Mouth/Throat:     Mouth: Mucous membranes are not dry.  Eyes:     Conjunctiva/sclera: Conjunctivae normal.  Neck:     Vascular: Normal carotid pulses. No JVD.     Trachea: Trachea normal. No tracheal deviation.  Cardiovascular:     Rate and Rhythm: Normal rate and regular rhythm.     Pulses: No decreased pulses.          Radial pulses are 2+ on the right side and 2+ on the left side.     Heart sounds: Normal heart sounds, S1 normal and S2 normal. No murmur heard. Pulmonary:     Effort: Pulmonary effort is normal. No respiratory distress.     Breath sounds: No wheezing.  Chest:     Chest wall: No tenderness.  Abdominal:     General: Bowel sounds are normal.     Palpations: Abdomen is soft.     Tenderness: There is no abdominal tenderness. There is no guarding or rebound.  Musculoskeletal:        General: Normal range of motion.     Cervical back: Normal range of motion and neck supple. No muscular tenderness.     Comments: Right leg: No edema there is some tenderness in the right popliteal space as well as the lateral proximal calf and distal thigh.  No overlying erythema or swelling.  Skin:    General: Skin is warm and dry.     Coloration: Skin is not pale.  Neurological:     Mental Status: She is alert.     ED Results / Procedures / Treatments   Labs (all labs ordered are listed, but only abnormal results are displayed) Labs Reviewed   BASIC METABOLIC PANEL  CBC  PREGNANCY, URINE  D-DIMER, QUANTITATIVE  TROPONIN I (HIGH SENSITIVITY)  TROPONIN I (HIGH SENSITIVITY)    ED ECG REPORT   Date: 04/27/2023  Rate: 92  Rhythm: normal sinus rhythm  QRS Axis: normal  Intervals: normal  ST/T Wave abnormalities: normal  Conduction Disutrbances:none  Narrative Interpretation:   Old EKG Reviewed: no changes  I have personally reviewed the EKG tracing and agree with the computerized printout as noted.   Radiology US Venous Img Lower Right (DVT Study)  Result Date: 04/27/2023 CLINICAL DATA:  Leg pain, chest pain EXAM: RIGHT LOWER EXTREMITY VENOUS DOPPLER ULTRASOUND TECHNIQUE: Gray-scale sonography with compression, as well as color and duplex ultrasound, were performed to evaluate the deep venous system(s) from the level of the common femoral vein through the popliteal and proximal calf veins. COMPARISON:  None Available. FINDINGS: VENOUS Normal compressibility of the common femoral, superficial femoral, and popliteal veins, as well as the visualized calf veins. Visualized portions of profunda femoral vein and great saphenous vein unremarkable. No filling defects to suggest DVT on grayscale or color Doppler imaging. Doppler waveforms show normal direction of venous flow, normal respiratory plasticity and response to augmentation. Limited views of the contralateral common femoral vein are unremarkable. OTHER None. Limitations: none IMPRESSION: Negative. Electronically Signed   By: Wiliam Ke M.D.   On: 04/27/2023 19:40   DG Chest 2 View  Result Date: 04/27/2023 CLINICAL DATA:  Chest pain EXAM: CHEST - 2 VIEW COMPARISON:  None Available. FINDINGS: Cardiac size is within normal limits. Lung fields are clear of any infiltrates or pulmonary edema. There is no pleural effusion or pneumothorax. There is mild dextroscoliosis in thoracic spine. IMPRESSION: No active cardiopulmonary disease. Electronically Signed   By: Ernie Avena  M.D.   On: 04/27/2023 18:44    Procedures Procedures    Medications Ordered in ED Medications - No data to display  ED Course/ Medical Decision Making/ A&P    Patient seen and examined. History obtained directly from patient.   Labs/EKG: Ordered CBC, BMP, troponin ordered in triage.  Added D-dimer.  Imaging: Ordered DVT study right lower extremity  Medications/Fluids: None ordered  Most recent vital signs reviewed and are as follows: BP (!) 185/116   Pulse 88   Temp 98.4 F (36.9 C) (Oral)   Resp (!) 25   Ht 5\' 5"  (1.651 m)   Wt 97.5 kg   SpO2 98%   BMI 35.78 kg/m   Initial impression: Patient with right lower extremity pain  x 1 month with intermittent vague chest pain.  No significant cardiac risk factors.  Patient is hypertensive here today.  She will be evaluated for PE, DVT, ACS, however she does look very comfortable at the current time and is no distress.  8:49 PM Reassessment performed. Patient appears stable.   Labs personally reviewed and interpreted including: CBC with unremarkable; BMP unremarkable; D-dimer normal; troponin 2 >> 3; pregnancy negative.  Imaging personally visualized and interpreted including: Chest x-ray was negative.  Reviewed results of DVT study, also negative.  Reviewed pertinent lab work and imaging with patient at bedside. Questions answered.   Most current vital signs reviewed and are as follows: BP (!) 168/101 (BP Location: Right Arm)   Pulse 80   Temp 98.4 F (36.9 C) (Oral)   Resp 20   Ht 5\' 5"  (1.651 m)   Wt 97.5 kg   SpO2 99%   BMI 35.78 kg/m   Plan: Discharge to home.   Prescriptions written for: Omeprazole, Pepcid  Other home care instructions discussed: Parke Simmers diet  ED return instructions discussed: Return and follow-up instructions: I encouraged patient to return to ED with severe chest pain, especially if the pain is crushing or pressure-like and spreads to the arms, back, neck, or jaw, or if they have associated  sweating, vomiting, or shortness of breath with the pain, or significant pain with activity. We discussed that the evaluation here today indicates a low-risk of serious cause of chest pain, including heart trouble or a blood clot, but no evaluation is perfect and chest pain can evolve with time. The patient verbalized understanding and agreed.  I encouraged patient to follow-up with their provider in the next 48 hours for recheck.    Follow-up instructions discussed: Patient encouraged to follow-up with their PCP in 2 days.                             Medical Decision Making Amount and/or Complexity of Data Reviewed Labs: ordered. Radiology: ordered.   For this patient's complaint of chest pain, the following emergent conditions were considered on the differential diagnosis: acute coronary syndrome, pulmonary embolism, pneumothorax, myocarditis, pericardial tamponade, aortic dissection, thoracic aortic aneurysm complication, esophageal perforation.   Other causes were also considered including: gastroesophageal reflux disease, musculoskeletal pain including costochondritis, pneumonia/pleurisy, herpes zoster, pericarditis.  In regards to possibility of ACS, patient has atypical features of pain, non-ischemic and unchanged EKG and negative troponin(s). Heart score was calculated to be 1.   In regards to possibility of PE, negative D-dimer and negative DVT study in this low risk patient.  Possibly GI in nature as the pain does get worse when the patient eats.  Leg pain, possibly musculoskeletal.  Negative DVT study, no signs of cellulitis or abscess.  The patient's vital signs, pertinent lab work and imaging were reviewed and interpreted as discussed in the ED course. Hospitalization was considered for further testing, treatments, or serial exams/observation. However as patient is well-appearing, has a stable exam, and reassuring studies today, I do not feel that they warrant admission at this  time. This plan was discussed with the patient who verbalizes agreement and comfort with this plan and seems reliable and able to return to the Emergency Department with worsening or changing symptoms.          Final Clinical Impression(s) / ED Diagnoses Final diagnoses:  Precordial pain  Pain of right lower extremity    Rx / DC  Orders ED Discharge Orders          Ordered    famotidine (PEPCID) 20 MG tablet  2 times daily        04/27/23 2056    omeprazole (PRILOSEC) 20 MG capsule  Daily        04/27/23 2056              Renne Crigler, PA-C 04/27/23 2056    Glyn Ade, MD 04/27/23 2107

## 2023-04-27 NOTE — Discharge Instructions (Signed)
Please read and follow all provided instructions.  Your diagnoses today include:  1. Precordial pain   2. Pain of right lower extremity     Tests performed today include: An EKG of your heart: No signs of heart rhythm problems A chest x-ray: Was normal Cardiac enzymes - a blood test for heart muscle damage, were both normal Blood counts and electrolytes DVT study of your leg did not show any signs of blood clot Vital signs. See below for your results today.   Medications prescribed:  Pepcid (famotidine) - antihistamine  You can find this medication over-the-counter.   DO NOT exceed:  20mg  Pepcid every 12 hours  Omeprazole (Prilosec) - stomach acid reducer  This medication can be found over-the-counter  Take any prescribed medications only as directed.  Follow-up instructions: Please follow-up with your primary care provider as soon as you can for further evaluation of your symptoms.   Return instructions:  SEEK IMMEDIATE MEDICAL ATTENTION IF: You have severe chest pain, especially if the pain is crushing or pressure-like and spreads to the arms, back, neck, or jaw, or if you have sweating, nausea or vomiting, or trouble with breathing. THIS IS AN EMERGENCY. Do not wait to see if the pain will go away. Get medical help at once. Call 911. DO NOT drive yourself to the hospital.  Your chest pain gets worse and does not go away after a few minutes of rest.  You have an attack of chest pain lasting longer than what you usually experience.  You have significant dizziness, if you pass out, or have trouble walking.  You have chest pain not typical of your usual pain for which you originally saw your caregiver.  You have any other emergent concerns regarding your health.  Additional Information: Chest pain comes from many different causes. Your caregiver has diagnosed you as having chest pain that is not specific for one problem, but does not require admission.  You are at low risk  for an acute heart condition or other serious illness.   Your vital signs today were: BP (!) 168/101 (BP Location: Right Arm)   Pulse 80   Temp 98.4 F (36.9 C) (Oral)   Resp 20   Ht 5\' 5"  (1.651 m)   Wt 97.5 kg   SpO2 99%   BMI 35.78 kg/m  If your blood pressure (BP) was elevated above 135/85 this visit, please have this repeated by your doctor within one month. --------------

## 2023-04-27 NOTE — ED Notes (Signed)
Patient ambulatory with steady gait to restroom. Patient now resting quietly in stretcher, respirations even, unlabored, no acute distress noted. Denies needs at this time.

## 2023-10-17 ENCOUNTER — Other Ambulatory Visit: Payer: Self-pay

## 2023-10-17 ENCOUNTER — Emergency Department (HOSPITAL_COMMUNITY)
Admission: EM | Admit: 2023-10-17 | Discharge: 2023-10-17 | Disposition: A | Discharge status: Home / Self Care | Payer: BC Managed Care – PPO | Attending: Emergency Medicine | Admitting: Emergency Medicine

## 2023-10-17 ENCOUNTER — Encounter (HOSPITAL_COMMUNITY): Payer: Self-pay

## 2023-10-17 DIAGNOSIS — T148XXA Other injury of unspecified body region, initial encounter: Secondary | ICD-10-CM

## 2023-10-17 DIAGNOSIS — Y92410 Unspecified street and highway as the place of occurrence of the external cause: Secondary | ICD-10-CM | POA: Insufficient documentation

## 2023-10-17 DIAGNOSIS — M549 Dorsalgia, unspecified: Secondary | ICD-10-CM | POA: Insufficient documentation

## 2023-10-17 MED ORDER — CYCLOBENZAPRINE HCL 10 MG PO TABS: 10.0000 mg | ORAL_TABLET | Freq: Two times a day (BID) | ORAL | 0 refills | Status: AC | PRN

## 2023-10-17 MED ORDER — LIDOCAINE 5 % EX PTCH: 1.0000 | MEDICATED_PATCH | CUTANEOUS | 0 refills | Status: AC

## 2023-10-17 MED ORDER — ACETAMINOPHEN 325 MG PO TABS
650.0000 mg | ORAL_TABLET | Freq: Once | ORAL | Status: AC
  Administered 2023-10-17: 650 mg via ORAL
  Filled 2023-10-17: qty 2

## 2023-10-17 MED ORDER — LIDOCAINE 5 % EX PTCH
1.0000 | MEDICATED_PATCH | CUTANEOUS | Status: DC
  Administered 2023-10-17: 1 via TRANSDERMAL
  Filled 2023-10-17: qty 1
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
